# Patient Record
Sex: Male | Born: 1990 | Race: Black or African American | Hispanic: No | Marital: Single | Smoking: Never smoker
Health system: Southern US, Community
[De-identification: ages and names within clinical notes are randomized; demographics above are authoritative.]

---

## 2013-11-27 ENCOUNTER — Ambulatory Visit (INDEPENDENT_AMBULATORY_CARE_PROVIDER_SITE_OTHER): Payer: PRIVATE HEALTH INSURANCE | Admitting: Emergency Medicine

## 2013-11-27 VITALS — BP 110/78 | HR 60 | Temp 98.0°F | Resp 16 | Ht 68.0 in | Wt 202.0 lb

## 2013-11-27 DIAGNOSIS — Z025 Encounter for examination for participation in sport: Secondary | ICD-10-CM

## 2013-11-27 DIAGNOSIS — Z0000 Encounter for general adult medical examination without abnormal findings: Secondary | ICD-10-CM

## 2013-11-27 LAB — POCT CBC
Granulocyte percent: 55.3 %G (ref 37–80)
HEMATOCRIT: 48.9 % (ref 43.5–53.7)
Hemoglobin: 15.5 g/dL (ref 14.1–18.1)
Lymph, poc: 3.5 — AB (ref 0.6–3.4)
MCH, POC: 30.3 pg (ref 27–31.2)
MCHC: 31.7 g/dL — AB (ref 31.8–35.4)
MCV: 95.7 fL (ref 80–97)
MID (cbc): 0.7 (ref 0–0.9)
MPV: 10.1 fL (ref 0–99.8)
POC Granulocyte: 5.2 (ref 2–6.9)
POC LYMPH PERCENT: 37.2 %L (ref 10–50)
POC MID %: 7.5 %M (ref 0–12)
Platelet Count, POC: 260 10*3/uL (ref 142–424)
RBC: 5.11 M/uL (ref 4.69–6.13)
RDW, POC: 12.5 %
WBC: 9.4 10*3/uL (ref 4.6–10.2)

## 2013-11-27 LAB — SICKLE CELL SCREEN: Sickle Cell Screen: NEGATIVE

## 2013-11-27 NOTE — Progress Notes (Signed)
 @[facility] @  Patient ID: Trevor Blanchard MRN: [id number], DOB: [birth year] 23 y.o. Date of Encounter: 11/27/2013, 2:03 PM  Primary Physician: No PCP Per Patient  Chief Complaint: Physical (CPE)  HPI: 23 y.o. y/o male with history noted below here for CPE.  Doing well.  Pt states he is unaware if he has ever been tested for sickle cell disease.  He denies currently being on any medication. Denies any known allergies to medications. No issues/complaints.  Review of Systems: Consitutional: No fever, chills, fatigue, night sweats, lymphadenopathy, or weight changes. Eyes: No visual changes, eye redness, or discharge. ENT/Mouth: Ears: No otalgia, tinnitus, hearing loss, discharge. Nose: No congestion, rhinorrhea, sinus pain, or epistaxis. Throat: No sore throat, post nasal drip, or teeth pain. Cardiovascular: No CP, palpitations, diaphoresis, DOE, edema, orthopnea, PND. Respiratory: No cough, hemoptysis, SOB, or wheezing. Gastrointestinal: No anorexia, dysphagia, reflux, pain, nausea, vomiting, hematemesis, diarrhea, constipation, BRBPR, or melena. Genitourinary: No dysuria, frequency, urgency, hematuria, incontinence, nocturia, decreased urinary stream, discharge, impotence, or testicular pain/masses. Musculoskeletal: No decreased ROM, myalgias, stiffness, joint swelling, or weakness. Skin: No rash, erythema, lesion changes, pain, warmth, jaundice, or pruritis. Neurological: No headache, dizziness, syncope, seizures, tremors, memory loss, coordination problems, or paresthesias. Psychological: No anxiety, depression, hallucinations, SI/HI. Endocrine: No fatigue, polydipsia, polyphagia, polyuria, or known diabetes. All other systems were reviewed and are otherwise negative.  History reviewed. No pertinent past medical history.   History reviewed. No pertinent past surgical history.  Home Meds:  Prior to Admission medications   Not on File    Allergies: No Known Allergies  History   Social  History  . Marital Status: Single    Spouse Name: N/A    Number of Children: N/A  . Years of Education: N/A   Occupational History  . Not on file.   Social History Main Topics  . Smoking status: Never Smoker   . Smokeless tobacco: Not on file  . Alcohol Use: No  . Drug Use: No  . Sexual Activity: Not on file   Other Topics Concern  . Not on file   Social History Narrative  . No narrative on file    History reviewed. No pertinent family history.  Physical Exam: Blood pressure 110/78, pulse 60, temperature 98 F (36.7 C), temperature source Oral, resp. rate 16, height 5\' 8"  (1.727 m), weight 202 lb (91.627 kg), SpO2 98.00%.  General: Well developed, well nourished, in no acute distress. HEENT: Normocephalic, atraumatic. Conjunctiva pink, sclera non-icteric. Pupils 2 mm constricting to 1 mm, round, regular, and equally reactive to light and accomodation. EOMI. Internal auditory canal clear. TMs with good cone of light and without pathology. Nasal mucosa pink. Nares are without discharge. No sinus tenderness. Oral mucosa pink. Dentition. Pharynx without exudate.   Neck: Supple. Trachea midline. No thyromegaly. Full ROM. No lymphadenopathy. Lungs: Clear to auscultation bilaterally without wheezes, rales, or rhonchi. Breathing is of normal effort and unlabored. Cardiovascular: RRR with S1 S2. No murmurs, rubs, or gallops appreciated. Distal pulses 2+ symmetrically. No carotid or abdominal bruits. Abdomen: Soft, non-tender, non-distended with normoactive bowel sounds. No hepatosplenomegaly or masses. No rebound/guarding. No CVA tenderness. Without hernias.  Rectal: Genitourinary:  circumcised male. No penile lesions. Testes descended bilaterally, and smooth without tenderness or masses.  Musculoskeletal: Full range of motion and 5/5 strength throughout. Without swelling, atrophy, tenderness, crepitus, or warmth. Extremities without clubbing, cyanosis, or edema. Calves supple. Skin: Warm  and moist without erythema, ecchymosis, wounds, or rash. Neuro: A+Ox3. CN II-XII grossly intact. Moves all  extremities spontaneously. Full sensation throughout. Normal gait. DTR 2+ throughout upper and lower extremities. Finger to nose intact. Psych:  Responds to questions appropriately with a normal affect.   Studies: We'll check a sickle prep and CBC because he is going to be doing vigorous physical exercise Results for orders placed in visit on 11/27/13  POCT CBC      Result Value Range   WBC 9.4  4.6 - 10.2 K/uL   Lymph, poc 3.5 (*) 0.6 - 3.4   POC LYMPH PERCENT 37.2  10 - 50 %L   MID (cbc) 0.7  0 - 0.9   POC MID % 7.5  0 - 12 %M   POC Granulocyte 5.2  2 - 6.9   Granulocyte percent 55.3  37 - 80 %G   RBC 5.11  4.69 - 6.13 M/uL   Hemoglobin 15.5  14.1 - 18.1 g/dL   HCT, POC 16.1  09.6 - 53.7 %   MCV 95.7  80 - 97 fL   MCH, POC 30.3  27 - 31.2 pg   MCHC 31.7 (*) 31.8 - 35.4 g/dL   RDW, POC 04.5     Platelet Count, POC 260  142 - 424 K/uL   MPV 10.1  0 - 99.8 fL     Assessment/Plan:  23 y.o. y/o   male here for CPE will check studies as noted   -  Signed, Earl Lites, MD 11/27/2013 2:03 PM

## 2013-11-28 ENCOUNTER — Telehealth: Payer: Self-pay

## 2013-11-28 NOTE — Telephone Encounter (Signed)
Patient states he is returning a call about lab results.   [phone]

## 2013-11-29 NOTE — Addendum Note (Signed)
Addended by: Cydney OkAUGUSTIN, Sindy Mccune N on: 11/29/2013 11:43 AM   Modules accepted: Level of Service

## 2013-11-29 NOTE — Telephone Encounter (Signed)
Spoke with pt advised labs normal 

## 2013-12-23 ENCOUNTER — Encounter (HOSPITAL_COMMUNITY): Payer: Self-pay | Admitting: Emergency Medicine

## 2013-12-23 ENCOUNTER — Other Ambulatory Visit (HOSPITAL_COMMUNITY)
Admission: RE | Admit: 2013-12-23 | Discharge: 2013-12-23 | Disposition: A | Discharge status: Home / Self Care | Payer: BC Managed Care – PPO | Source: Ambulatory Visit | Attending: Family Medicine | Admitting: Family Medicine

## 2013-12-23 ENCOUNTER — Emergency Department (HOSPITAL_COMMUNITY)
Admission: EM | Admit: 2013-12-23 | Discharge: 2013-12-23 | Disposition: A | Discharge status: Home / Self Care | Payer: BC Managed Care – PPO | Source: Home / Self Care

## 2013-12-23 DIAGNOSIS — Z113 Encounter for screening for infections with a predominantly sexual mode of transmission: Secondary | ICD-10-CM | POA: Insufficient documentation

## 2013-12-23 DIAGNOSIS — N342 Other urethritis: Secondary | ICD-10-CM

## 2013-12-23 DIAGNOSIS — R369 Urethral discharge, unspecified: Secondary | ICD-10-CM

## 2013-12-23 MED ORDER — LIDOCAINE HCL (PF) 1 % IJ SOLN
INTRAMUSCULAR | Status: AC
  Filled 2013-12-23: qty 5

## 2013-12-23 MED ORDER — CEFTRIAXONE SODIUM 250 MG IJ SOLR
250.0000 mg | Freq: Once | INTRAMUSCULAR | Status: AC
  Administered 2013-12-23: 250 mg via INTRAMUSCULAR

## 2013-12-23 MED ORDER — DOXYCYCLINE HYCLATE 100 MG PO CAPS
100.0000 mg | ORAL_CAPSULE | Freq: Two times a day (BID) | ORAL | Status: DC
Start: 2013-12-23 — End: 2015-01-19

## 2013-12-23 MED ORDER — CEFTRIAXONE SODIUM 250 MG IJ SOLR
INTRAMUSCULAR | Status: AC
  Filled 2013-12-23: qty 250

## 2013-12-23 NOTE — Discharge Instructions (Signed)
Urethritis, Adult Urethritis is an inflammation of the tube through which urine exits your bladder (urethra).  CAUSES Urethritis is often caused by an infection in your urethra. The infection can be viral, like herpes. The infection can also be bacterial, like gonorrhea. RISK FACTORS Risk factors of urethritis include:  Having sex without using a condom.  Having multiple sexual partners.  Having poor hygiene. SIGNS AND SYMPTOMS Symptoms of urethritis are less noticeable in women than in men. These symptoms include:  Burning feeling when you urinate (dysuria).  Discharge from your urethra.  Blood in your urine (hematuria).  Urinating more than usual. DIAGNOSIS  To confirm a diagnosis of urethritis, your health care provider will do the following:  Ask about your sexual history.  Perform a physical exam.  Have you provide a sample of your urine for lab testing.  Use a cotton swab to gently collect a sample from your urethra for lab testing. TREATMENT  It is important to treat urethritis. Depending on the cause, untreated urethritis may lead to serious genital infections and possibly infertility. Urethritis caused by a bacterial infection is treated with antibiotics. All sexual partners must be treated.  HOME CARE INSTRUCTIONS  Do not have sex until the test results are known and treatment is completed, even if your symptoms go away before you finish treatment.  Finish all medicines that you are prescribed. SEEK MEDICAL CARE IF:   Your symptoms are not improved in 3 days.  Your symptoms are getting worse.  You develop abdominal pain or pelvic pain (in women).  You develop joint pain. SEEK IMMEDIATE MEDICAL CARE IF:   You have a fever with a temperature of 101.41F (38.8C) or greater.  You have severe pain in the belly, back, or side.  You have repeated vomiting. Document Released: 04/23/2001 Document Revised: 08/18/2013 Document Reviewed: 06/28/2013 [facility]  Patient Information 2014 [place], [place].  Sexually Transmitted Disease A sexually transmitted disease (STD) is a disease or infection that may be passed (transmitted) from person to person, usually during sexual activity. This may happen by way of saliva, semen, blood, vaginal mucus, or urine. Common STDs include:   Gonorrhea.   Chlamydia.   Syphilis.   HIV and AIDS.   Genital herpes.   Hepatitis B and C.   Trichomonas.   Human papillomavirus (HPV).   Pubic lice.   Scabies.  Mites.  Bacterial vaginosis. WHAT ARE CAUSES OF STDs? An STD may be caused by bacteria, a virus, or parasites. STDs are often transmitted during sexual activity if one person is infected. However, they may also be transmitted through nonsexual means. STDs may be transmitted after:   Sexual intercourse with an infected person.   Sharing sex toys with an infected person.   Sharing needles with an infected person or using unclean piercing or tattoo needles.  Having intimate contact with the genitals, mouth, or rectal areas of an infected person.   Exposure to infected fluids during birth. WHAT ARE THE SIGNS AND SYMPTOMS OF STDs? Different STDs have different symptoms. Some people may not have any symptoms. If symptoms are present, they may include:   Painful or bloody urination.   Pain in the pelvis, abdomen, vagina, anus, throat, or eyes.   Skin rash, itching, irritation, growths, sores (lesions), ulcerations, or warts in the genital or anal area.  Abnormal vaginal discharge with or without bad odor.   Penile discharge in men.   Fever.   Pain or bleeding during sexual intercourse.   Swollen glands  in the groin area.   Yellow skin and eyes (jaundice). This is seen with hepatitis.   Swollen testicles.  Infertility.  Sores and blisters in the mouth. HOW ARE STDs DIAGNOSED? To make a diagnosis, your health care provider may:   Take a medical history.   Perform  a physical exam.   Take a sample of any discharge for examination.  Swab the throat, cervix, opening to the penis, rectum, or vagina for testing.  Test a sample of your first morning urine.   Perform blood tests.   Perform a Pap smear, if this applies.   Perform a colposcopy.   Perform a laparoscopy.  HOW ARE STDs TREATED? Treatment depends on the STD. Some STDs may be treated but not cured.   Chlamydia, gonorrhea, trichomonas, and syphilis can be cured with antibiotics.   Genital herpes, hepatitis, and HIV can be treated, but not cured, with prescribed medicines. The medicines lessen symptoms.   Genital warts from HPV can be treated with medicine or by freezing, burning (electrocautery), or surgery. Warts may come back.   HPV cannot be cured with medicine or surgery. However, abnormal areas may be removed from the cervix, vagina, or vulva.   If your diagnosis is confirmed, your recent sexual partners need treatment. This is true even if they are symptom-free or have a negative culture or evaluation. They should not have sex until their health care providers say it is OK. HOW CAN I REDUCE MY RISK OF GETTING AN STD?  Use latex condoms, dental dams, and water-soluble lubricants during sexual activity. Do not use petroleum jelly or oils.  Get vaccinated for HPV and hepatitis. If you have not received these vaccines in the past, talk to your health care provider about whether one or both might be right for you.   Avoid risky sex practices that can break the skin.  WHAT SHOULD I DO IF I THINK I HAVE AN STD?  See your health care provider.   Inform all sexual partners. They should be tested and treated for any STDs.  Do not have sex until your health care provider says it is OK. WHEN SHOULD I GET HELP? Seek immediate medical care if:  You develop severe abdominal pain.  You are a man and notice swelling or pain in the testicles.  You are a woman and notice  swelling or pain in your vagina. Document Released: 01/18/2003 Document Revised: 08/18/2013 Document Reviewed: 05/18/2013 [facility]Exit[facility] Patient Information 2014 [place], [place]LL[place].

## 2013-12-23 NOTE — ED Provider Notes (Signed)
Medical screening examination/treatment/procedure(s) were performed by a resident physician or non-physician practitioner and as the supervising physician I was immediately available for consultation/collaboration.  Evan Corey, MD    Evan S Corey, MD 12/23/13 2112 

## 2013-12-23 NOTE — ED Notes (Signed)
Call back number verified.  

## 2013-12-23 NOTE — ED Notes (Signed)
C/o burning with urination that started 3 days ago. Has a discharge, denies any n/v/d. Written by: Marga MelnickQuaNeisha Jones, SMA

## 2013-12-23 NOTE — ED Provider Notes (Signed)
CSN: [id number]     Arrival date & time 12/23/13  1424 History   First MD Initiated Contact with Patient 12/23/13 1510     Chief Complaint  Patient presents with  . Exposure to STD     (Consider location/radiation/quality/duration/timing/severity/associated sxs/prior Treatment) HPI Comments: 23 year old male presents with a chief complaint of burning with urination and brownish colored penile discharge to 3 days. He is sexually active. Denies any lesions or genital pain.   History reviewed. No pertinent past medical history. History reviewed. No pertinent past surgical history. History reviewed. No pertinent family history. History  Substance Use Topics  . Smoking status: Never Smoker   . Smokeless tobacco: Not on file  . Alcohol Use: No    Review of Systems  Constitutional: Negative.   Respiratory: Negative.   Gastrointestinal: Negative.   Genitourinary: Positive for dysuria and discharge. Negative for frequency, flank pain, scrotal swelling, difficulty urinating and testicular pain.  Musculoskeletal: Negative.       Allergies  Review of patient's allergies indicates no known allergies.  Home Medications   Current Outpatient Rx  Name  Route  Sig  Dispense  Refill  . doxycycline (VIBRAMYCIN) 100 MG capsule   Oral   Take 1 capsule (100 mg total) by mouth 2 (two) times daily.   20 capsule   0    BP 146/80  Pulse 62  Temp(Src) 99.2 F (37.3 C) (Oral)  Resp 18  SpO2 97% Physical Exam  Nursing note and vitals reviewed. Constitutional: He is oriented to person, place, and time. He appears well-developed and well-nourished. No distress.  Neck: Normal range of motion. Neck supple.  Pulmonary/Chest: Effort normal. No respiratory distress.  Abdominal: Soft. He exhibits no mass. There is no tenderness.  Genitourinary: Penis normal. No penile tenderness.  Normal external male genitalia No scrotal, testicular or epididymal tenderness. No swelling. No pain in the penis  with palpation. The penis is uncircumcised. There is moisture within the foreskin but unable to discern any color. No other lesions observed.  Neurological: He is alert and oriented to person, place, and time. He exhibits normal muscle tone.  Skin: Skin is warm and dry.  Psychiatric: He has a normal mood and affect.    ED Course  Procedures (including critical care time) Labs Review Labs Reviewed  URINE CYTOLOGY ANCILLARY ONLY   Imaging Review No results found.    MDM   Final diagnoses:  Abnormal penile discharge  Urethritis    Doxycycline 100 mg twice a day for 10 days Rocephin 250 mg IM Cytology results pending If symptoms are not better in 4 days return for additional testing.    Hayden Rasmussenavid Amber Guthridge, NP 12/23/13 1525

## 2013-12-25 NOTE — ED Notes (Signed)
Patient called to inquire about test reports . Patient will advise partners about any positive findings, and will refrain from sex until full treatment course finished. Adequate treatment while in clinic

## 2013-12-25 NOTE — ED Notes (Signed)
[facility] DHHS [id number] [facility]

## 2014-04-27 ENCOUNTER — Emergency Department (HOSPITAL_COMMUNITY)
Admission: EM | Admit: 2014-04-27 | Discharge: 2014-04-27 | Disposition: A | Discharge status: Home / Self Care | Payer: BC Managed Care – PPO | Source: Home / Self Care | Attending: Family Medicine | Admitting: Family Medicine

## 2014-04-27 ENCOUNTER — Other Ambulatory Visit (HOSPITAL_COMMUNITY)
Admission: RE | Admit: 2014-04-27 | Discharge: 2014-04-27 | Disposition: A | Discharge status: Home / Self Care | Payer: BC Managed Care – PPO | Source: Ambulatory Visit | Attending: Family Medicine | Admitting: Family Medicine

## 2014-04-27 ENCOUNTER — Encounter (HOSPITAL_COMMUNITY): Payer: Self-pay | Admitting: Emergency Medicine

## 2014-04-27 DIAGNOSIS — Z113 Encounter for screening for infections with a predominantly sexual mode of transmission: Secondary | ICD-10-CM | POA: Insufficient documentation

## 2014-04-27 DIAGNOSIS — N342 Other urethritis: Secondary | ICD-10-CM

## 2014-04-27 LAB — HIV ANTIBODY (ROUTINE TESTING W REFLEX): HIV 1&2 Ab, 4th Generation: NONREACTIVE

## 2014-04-27 LAB — RPR

## 2014-04-27 MED ORDER — AZITHROMYCIN 250 MG PO TABS
1000.0000 mg | ORAL_TABLET | Freq: Once | ORAL | Status: AC
  Administered 2014-04-27: 1000 mg via ORAL

## 2014-04-27 MED ORDER — CEFTRIAXONE SODIUM 250 MG IJ SOLR
250.0000 mg | Freq: Once | INTRAMUSCULAR | Status: AC
  Administered 2014-04-27: 250 mg via INTRAMUSCULAR

## 2014-04-27 MED ORDER — AZITHROMYCIN 250 MG PO TABS
ORAL_TABLET | ORAL | Status: AC
  Filled 2014-04-27: qty 4

## 2014-04-27 MED ORDER — LIDOCAINE HCL (PF) 1 % IJ SOLN
INTRAMUSCULAR | Status: AC
  Filled 2014-04-27: qty 5

## 2014-04-27 MED ORDER — CEFTRIAXONE SODIUM 250 MG IJ SOLR
INTRAMUSCULAR | Status: AC
  Filled 2014-04-27: qty 250

## 2014-04-27 NOTE — ED Provider Notes (Signed)
Trevor Blanchard is a 23 y.o. male who presents to Urgent Care today for penile discharge associated with burning sensation present for a few weeks. Similar to prior episodes of gonorrhea and Chlamydia. No fevers or chills nausea vomiting or diarrhea. Patient has had unprotected sex since his last test and treatment.   History reviewed. No pertinent past medical history. History  Substance Use Topics  . Smoking status: Never Smoker   . Smokeless tobacco: Not on file  . Alcohol Use: No   ROS as above Medications: No current facility-administered medications for this encounter.   Current Outpatient Prescriptions  Medication Sig Dispense Refill  . doxycycline (VIBRAMYCIN) 100 MG capsule Take 1 capsule (100 mg total) by mouth 2 (two) times daily.  20 capsule  0    Exam:  BP 126/76  Pulse 51  Temp(Src) 98.3 F (36.8 C) (Oral)  Resp 16  SpO2 100% Gen: Well NAD Genital: No lymphadenopathy. Testicles are descended bilaterally no masses or tenderness. The penis is uncircumcised normal. No lesions. Small amount of discharge from the glans.   No results found for this or any previous visit (from the past 24 hour(s)). No results found.  Assessment and Plan: 23 y.o. male with urethritis. Cytology pending. Empiric treatment with ceftriaxone and azithromycin. HIV and RPR test pending as well.  Discussed warning signs or symptoms. Please see discharge instructions. Patient expresses understanding.    Rodolph BongEvan S Corey, MD 04/27/14 [phone]

## 2014-04-27 NOTE — Discharge Instructions (Signed)
Thank you for coming in today.   Sexually Transmitted Disease A sexually transmitted disease (STD) is a disease or infection that may be passed (transmitted) from person to person, usually during sexual activity. This may happen by way of saliva, semen, blood, vaginal mucus, or urine. Common STDs include:   Gonorrhea.   Chlamydia.   Syphilis.   HIV and AIDS.   Genital herpes.   Hepatitis B and C.   Trichomonas.   Human papillomavirus (HPV).   Pubic lice.   Scabies.  Mites.  Bacterial vaginosis. WHAT ARE CAUSES OF STDs? An STD may be caused by bacteria, a virus, or parasites. STDs are often transmitted during sexual activity if one person is infected. However, they may also be transmitted through nonsexual means. STDs may be transmitted after:   Sexual intercourse with an infected person.   Sharing sex toys with an infected person.   Sharing needles with an infected person or using unclean piercing or tattoo needles.  Having intimate contact with the genitals, mouth, or rectal areas of an infected person.   Exposure to infected fluids during birth. WHAT ARE THE SIGNS AND SYMPTOMS OF STDs? Different STDs have different symptoms. Some people may not have any symptoms. If symptoms are present, they may include:   Painful or bloody urination.   Pain in the pelvis, abdomen, vagina, anus, throat, or eyes.   A skin rash, itching, or irritation.  Growths, ulcerations, blisters, or sores in the genital and anal areas.  Abnormal vaginal discharge with or without bad odor.   Penile discharge in men.   Fever.   Pain or bleeding during sexual intercourse.   Swollen glands in the groin area.   Yellow skin and eyes (jaundice). This is seen with hepatitis.   Swollen testicles.  Infertility.  Sores and blisters in the mouth. HOW ARE STDs DIAGNOSED? To make a diagnosis, your health care provider may:   Take a medical history.   Perform a  physical exam.   Take a sample of any discharge to examine.  Swab the throat, cervix, opening to the penis, rectum, or vagina for testing.  Test a sample of your first morning urine.   Perform blood tests.   Perform a Pap test, if this applies.   Perform a colposcopy.   Perform a laparoscopy.  HOW ARE STDs TREATED? Treatment depends on the STD. Some STDs may be treated but not cured.   Chlamydia, gonorrhea, trichomonas, and syphilis can be cured with antibiotic medicine.   Genital herpes, hepatitis, and HIV can be treated, but not cured, with prescribed medicines. The medicines lessen symptoms.   Genital warts from HPV can be treated with medicine or by freezing, burning (electrocautery), or surgery. Warts may come back.   HPV cannot be cured with medicine or surgery. However, abnormal areas may be removed from the cervix, vagina, or vulva.   If your diagnosis is confirmed, your recent sexual partners need treatment. This is true even if they are symptom-free or have a negative culture or evaluation. They should not have sex until their health care providers say it is okay. HOW CAN I REDUCE MY RISK OF GETTING AN STD? Take these steps to reduce your risk of getting an STD:  Use latex condoms, dental dams, and water-soluble lubricants during sexual activity. Do not use petroleum jelly or oils.  Avoid having multiple sex partners.  Do not have sex with someone who has other sex partners.  Do not have sex with anyone  you do not know or who is at high risk for an STD.  Avoid risky sex practices that can break your skin.  Do not have sex if you have open sores on your mouth or skin.  Avoid drinking too much alcohol or taking illegal drugs. Alcohol and drugs can affect your judgment and put you in a vulnerable position.  Avoid engaging in oral and anal sex acts.  Get vaccinated for HPV and hepatitis. If you have not received these vaccines in the past, talk to your  health care provider about whether one or both might be right for you.   If you are at risk of being infected with HIV, it is recommended that you take a prescription medicine daily to prevent HIV infection. This is called pre-exposure prophylaxis (PrEP). You are considered at risk if:  You are a man who has sex with other men (MSM).  You are a heterosexual man or woman and are sexually active with more than one partner.  You take drugs by injection.  You are sexually active with a partner who has HIV.  Talk with your health care provider about whether you are at high risk of being infected with HIV. If you choose to begin PrEP, you should first be tested for HIV. You should then be tested every 3 months for as long as you are taking PrEP.  WHAT SHOULD I DO IF I THINK I HAVE AN STD?  See your health care provider.   Tell your sexual partner(s). They should be tested and treated for any STDs.  Do not have sex until your health care provider says it is okay. WHEN SHOULD I GET IMMEDIATE MEDICAL CARE? Contact your health care provider right away if:   You have severe abdominal pain.  You are a man and notice swelling or pain in your testicles.  You are a woman and notice swelling or pain in your vagina. Document Released: 01/18/2003 Document Revised: 11/02/2013 Document Reviewed: 05/18/2013 [facility]ExitCare Patient Information 2015 [place]ExitCare, [place]LL[place]. This information is not intended to replace advice given to you by your health care provider. Make sure you discuss any questions you have with your health care provider.  Urethritis, Adult Urethritis is an inflammation of the tube through which urine exits your bladder (urethra).  CAUSES Urethritis is often caused by an infection in your urethra. The infection can be viral, like herpes. The infection can also be bacterial, like gonorrhea. RISK FACTORS Risk factors of urethritis include:  Having sex without using a condom.  Having multiple  sexual partners.  Having poor hygiene. SIGNS AND SYMPTOMS Symptoms of urethritis are less noticeable in women than in men. These symptoms include:  Burning feeling when you urinate (dysuria).  Discharge from your urethra.  Blood in your urine (hematuria).  Urinating more than usual. DIAGNOSIS  To confirm a diagnosis of urethritis, your health care provider will do the following:  Ask about your sexual history.  Perform a physical exam.  Have you provide a sample of your urine for lab testing.  Use a cotton swab to gently collect a sample from your urethra for lab testing. TREATMENT  It is important to treat urethritis. Depending on the cause, untreated urethritis may lead to serious genital infections and possibly infertility. Urethritis caused by a bacterial infection is treated with antibiotics. All sexual partners must be treated.  HOME CARE INSTRUCTIONS  Do not have sex until the test results are known and treatment is completed, even if your  symptoms go away before you finish treatment.  Finish all medicines that you are prescribed. SEEK MEDICAL CARE IF:   Your symptoms are not improved in 3 days.  Your symptoms are getting worse.  You develop abdominal pain or pelvic pain (in women).  You develop joint pain. SEEK IMMEDIATE MEDICAL CARE IF:   You have a fever with a temperature of 101.49F (38.8C) or greater.  You have severe pain in the belly, back, or side.  You have repeated vomiting. Document Released: 04/23/2001 Document Revised: 08/18/2013 Document Reviewed: 06/28/2013 [facility]Exit[facility] Patient Information 2015 [place]ExitCare, [place]LL[place]. This information is not intended to replace advice given to you by your health care provider. Make sure you discuss any questions you have with your health care provider.

## 2014-04-27 NOTE — ED Notes (Signed)
C/o penile discharge  States he previously was treat for gonorrhea x 2 months ago States he has burning when urinating

## 2014-04-28 ENCOUNTER — Telehealth (HOSPITAL_COMMUNITY): Payer: Self-pay | Admitting: *Deleted

## 2014-04-28 NOTE — ED Notes (Signed)
Pt. called for his lab results.  Pt. verified x 2 and given results. (GC and Trich neg., Chlamydia pos., HIV/RPR non-reactive).  Pt. told he was adequately treated with Zithromax.  Pt. instructed to notify his partner, no sex for 1 week and to practice safe sex. Pt. told he [facility] by appointment.  Pt. voiced understanding.  [facility]. Vassie MoselleYork, Suzanne M 04/28/2014

## 2014-05-03 NOTE — ED Notes (Signed)
Pt. called for his lab results.  Hard to understand name.  I asked for him to repeat it. Sounds like a male on the phone.  Pt. verified x 2 and chart accessed.  I told the person on the phone that I already gave him his results on 6/18.  He said his Mom was there and wants to hear them.   I told the person on the phone that "you are 23 yo and you can tell your Mom your results."  The person said OK and hung up. Vassie MoselleYork, Suzanne M 05/03/2014

## 2014-06-07 ENCOUNTER — Other Ambulatory Visit (HOSPITAL_COMMUNITY)
Admission: RE | Admit: 2014-06-07 | Discharge: 2014-06-07 | Disposition: A | Discharge status: Home / Self Care | Payer: BC Managed Care – PPO | Source: Ambulatory Visit | Attending: Emergency Medicine | Admitting: Emergency Medicine

## 2014-06-07 ENCOUNTER — Emergency Department (HOSPITAL_COMMUNITY)
Admission: EM | Admit: 2014-06-07 | Discharge: 2014-06-07 | Disposition: A | Discharge status: Home / Self Care | Payer: BC Managed Care – PPO | Source: Home / Self Care | Attending: Emergency Medicine | Admitting: Emergency Medicine

## 2014-06-07 ENCOUNTER — Encounter (HOSPITAL_COMMUNITY): Payer: Self-pay | Admitting: Emergency Medicine

## 2014-06-07 DIAGNOSIS — N342 Other urethritis: Secondary | ICD-10-CM

## 2014-06-07 DIAGNOSIS — Z113 Encounter for screening for infections with a predominantly sexual mode of transmission: Secondary | ICD-10-CM | POA: Insufficient documentation

## 2014-06-07 LAB — POCT URINALYSIS DIP (DEVICE)
Glucose, UA: NEGATIVE mg/dL
Hgb urine dipstick: NEGATIVE
Ketones, ur: NEGATIVE mg/dL
Leukocytes, UA: NEGATIVE
Nitrite: NEGATIVE
PH: 5.5 (ref 5.0–8.0)
Protein, ur: NEGATIVE mg/dL
Specific Gravity, Urine: >=1.03 (ref 1.005–1.030)
Urobilinogen, UA: 0.2 mg/dL (ref 0.0–1.0)

## 2014-06-07 NOTE — ED Provider Notes (Signed)
CSN: [id number]     Arrival date & time 06/07/14  1714 History   First MD Initiated Contact with Patient 06/07/14 1815     Chief Complaint  Patient presents with  . Exposure to STD   (Consider location/radiation/quality/duration/timing/severity/associated sxs/prior Treatment) HPI Comments: 23 year old male presents requesting STD testing. He complains of a very mild tingling sensation after urination a couple of times over the past 2 weeks, and he may have noticed a slight discharge this morning. He has a history of Chlamydia treated about a month ago. He just wants to make sure that he has gotten all the way better. He denies being sexually active since then. No genital lesions or testicle pain.  Patient is a 23 y.o. male presenting with STD exposure.  Exposure to STD    History reviewed. No pertinent past medical history. History reviewed. No pertinent past surgical history. History reviewed. No pertinent family history. History  Substance Use Topics  . Smoking status: Never Smoker   . Smokeless tobacco: Not on file  . Alcohol Use: No    Review of Systems  Genitourinary: Positive for dysuria and discharge.  All other systems reviewed and are negative.   Allergies  Review of patient's allergies indicates no known allergies.  Home Medications   Prior to Admission medications   Medication Sig Start Date End Date Taking? Authorizing Provider  doxycycline (VIBRAMYCIN) 100 MG capsule Take 1 capsule (100 mg total) by mouth 2 (two) times daily. 12/23/13   Hayden Rasmussenavid Mabe, NP   BP 116/71  Pulse 56  Temp(Src) 98.1 F (36.7 C) (Oral)  Resp 12  SpO2 100% Physical Exam  Nursing note and vitals reviewed. Constitutional: He is oriented to person, place, and time. He appears well-developed and well-nourished. No distress.  HENT:  Head: Normocephalic.  Pulmonary/Chest: Effort normal. No respiratory distress.  Abdominal: Soft. There is no tenderness.  Genitourinary: Testes normal and  penis normal. Right testis shows no tenderness. Left testis shows no tenderness. Uncircumcised.  Lymphadenopathy:       Right: No inguinal adenopathy present.       Left: No inguinal adenopathy present.  Neurological: He is alert and oriented to person, place, and time. Coordination normal.  Skin: Skin is warm and dry. No rash noted. He is not diaphoretic.  Psychiatric: He has a normal mood and affect. Judgment normal.    ED Course  Procedures (including critical care time) Labs Review Labs Reviewed  POCT URINALYSIS DIP (DEVICE) - Abnormal; Notable for the following:    Bilirubin Urine SMALL (*)    All other components within normal limits  URINE CYTOLOGY ANCILLARY ONLY    Imaging Review No results found.   MDM   1. Urethritis    Exam is normal. Sent urine cytology. Will call with results.    Graylon GoodZachary H Taylr Meuth, PA-C 06/07/14 1906

## 2014-06-07 NOTE — Discharge Instructions (Signed)
Urethritis °Urethritis is an inflammation of the tube through which urine exits your bladder (urethra).  °CAUSES °Urethritis is often caused by an infection in your urethra. The infection can be viral, like herpes. The infection can also be bacterial, like gonorrhea. °RISK FACTORS °Risk factors of urethritis include: °· Having sex without using a condom. °· Having multiple sexual partners. °· Having poor hygiene. °SIGNS AND SYMPTOMS °Symptoms of urethritis are less noticeable in women than in men. These symptoms include: °· Burning feeling when you urinate (dysuria). °· Discharge from your urethra. °· Blood in your urine (hematuria). °· Urinating more than usual. °DIAGNOSIS  °To confirm a diagnosis of urethritis, your health care provider will do the following: °· Ask about your sexual history. °· Perform a physical exam. °· Have you provide a sample of your urine for lab testing. °· Use a cotton swab to gently collect a sample from your urethra for lab testing. °TREATMENT  °It is important to treat urethritis. Depending on the cause, untreated urethritis may lead to serious genital infections and possibly infertility. Urethritis caused by a bacterial infection is treated with antibiotic medicine. All sexual partners must be treated.  °HOME CARE INSTRUCTIONS °· Do not have sex until the test results are known and treatment is completed, even if your symptoms go away before you finish treatment. °· If you were prescribed an antibiotic, finish it all even if you start to feel better. °SEEK MEDICAL CARE IF:  °· Your symptoms are not improved in 3 days. °· Your symptoms are getting worse. °· You develop abdominal pain or pelvic pain (in women). °· You develop joint pain. °· You have a fever. °SEEK IMMEDIATE MEDICAL CARE IF:  °· You have severe pain in the belly, back, or side. °· You have repeated vomiting. °MAKE SURE YOU: °· Understand these instructions. °· Will watch your condition. °· Will get help right away if you  are not doing well or get worse. °Document Released: 04/23/2001 Document Revised: 03/14/2014 Document Reviewed: 06/28/2013 °ExitCare® Patient Information ©2015 [employer]. This information is not intended to replace advice given to you by your health care provider. Make sure you discuss any questions you have with your health care provider. ° °

## 2014-06-07 NOTE — ED Provider Notes (Signed)
Medical screening examination/treatment/procedure(s) were performed by resident physician or non-physician practitioner and as supervising physician I was immediately available for consultation/collaboration.  Randal BubaErin Honig, MD     Charm RingsErin J Honig, MD 06/07/14 [phone]

## 2014-06-07 NOTE — ED Notes (Signed)
He thinks he saw a discharge this AM.  C/o of tingling in his penis off and on x 2 weeks.  Denies any sex.  States he came back to be rechecked.

## 2014-06-09 NOTE — ED Notes (Addendum)
1906  [facility]. States he called several times today for his results. I told him I was in staffing all day, today, because staff member had a meeting.  I said I have not picked up phone messages today.  Labs printed.  Pt. verified x 2 and shown his neg results.  GC/Chlamydia and Trich neg. Vassie MoselleYork, Thomas Mabry M 06/09/2014

## 2014-06-11 ENCOUNTER — Encounter (HOSPITAL_COMMUNITY): Payer: Self-pay | Admitting: Emergency Medicine

## 2014-06-11 ENCOUNTER — Emergency Department (HOSPITAL_COMMUNITY)
Admission: EM | Admit: 2014-06-11 | Discharge: 2014-06-11 | Disposition: A | Discharge status: Home / Self Care | Payer: BC Managed Care – PPO | Attending: Emergency Medicine | Admitting: Emergency Medicine

## 2014-06-11 DIAGNOSIS — N4889 Other specified disorders of penis: Secondary | ICD-10-CM | POA: Insufficient documentation

## 2014-06-11 DIAGNOSIS — IMO0002 Reserved for concepts with insufficient information to code with codable children: Secondary | ICD-10-CM

## 2014-06-11 DIAGNOSIS — Z792 Long term (current) use of antibiotics: Secondary | ICD-10-CM | POA: Insufficient documentation

## 2014-06-11 DIAGNOSIS — Z202 Contact with and (suspected) exposure to infections with a predominantly sexual mode of transmission: Secondary | ICD-10-CM | POA: Insufficient documentation

## 2014-06-11 DIAGNOSIS — N489 Disorder of penis, unspecified: Secondary | ICD-10-CM

## 2014-06-11 DIAGNOSIS — R1909 Other intra-abdominal and pelvic swelling, mass and lump: Secondary | ICD-10-CM | POA: Insufficient documentation

## 2014-06-11 MED ORDER — DOXYCYCLINE HYCLATE 100 MG PO CAPS
100.0000 mg | ORAL_CAPSULE | Freq: Two times a day (BID) | ORAL | Status: DC
Start: 2014-06-11 — End: 2015-01-19

## 2014-06-11 NOTE — ED Provider Notes (Signed)
CSN: [id number]     Arrival date & time 06/11/14  1842 History  This chart was scribed for Jaynie Crumble, PA, working with Flint Melter, MD by Chestine Spore, ED Scribe. The patient was seen in room TR08C/TR08C at 7:02 PM.   Chief Complaint  Patient presents with  . Exposure to STD     The history is provided by the patient. No language interpreter was used.   HPI Comments: Trevor Blanchard is a 23 y.o. male who presents to the Emergency Department complaining of an exposure to STD onset 2 weeks. He states that he did not pay much attention to it. He states that it is not getting worse, its just still there. He states that he has a raised area to the right groin area. He states that the area is tender to touch.He states that he noticed the bump on his penis today.  He states that there is also a bump on his penis. He denies dysuria, dyspareunia, and any other associated symptoms.    History reviewed. No pertinent past medical history. History reviewed. No pertinent past surgical history. History reviewed. No pertinent family history. History  Substance Use Topics  . Smoking status: Never Smoker   . Smokeless tobacco: Not on file  . Alcohol Use: No    Review of Systems  Genitourinary: Negative for dysuria.       No dyspareunia.   Skin:       "bump" on penis and on the groin.     Allergies  Review of patient's allergies indicates no known allergies.  Home Medications   Prior to Admission medications   Medication Sig Start Date End Date Taking? Authorizing Provider  doxycycline (VIBRAMYCIN) 100 MG capsule Take 1 capsule (100 mg total) by mouth 2 (two) times daily. 12/23/13   Hayden Rasmussen, NP   BP 130/83  Pulse 53  Temp(Src) 98.6 F (37 C) (Oral)  Resp 18  SpO2 100%  Physical Exam  Nursing note and vitals reviewed. Constitutional: He is oriented to person, place, and time. He appears well-developed and well-nourished. No distress.  HENT:  Head: Normocephalic and  atraumatic.  Eyes: EOM are normal.  Neck: Neck supple. No tracheal deviation present.  Cardiovascular: Normal rate.   Pulmonary/Chest: Effort normal. No respiratory distress.  Genitourinary:  1 cm cystic structure in the right inguinal area, skin-colored, nontender. No drainage. No surrounding erythema or induration. Small penile pustules to the right of the mid shaft. No tenderness to palpation. Not vesicular  Musculoskeletal: Normal range of motion.  Neurological: He is alert and oriented to person, place, and time.  Skin: Skin is warm and dry.  Psychiatric: He has a normal mood and affect. His behavior is normal.    ED Course  Procedures (including critical care time) DIAGNOSTIC STUDIES: Oxygen Saturation is 100% on room air, normal by my interpretation.    COORDINATION OF CARE: 7:05 PM-Discussed treatment plan which includes labs and Vibramycin with pt at bedside and pt agreed to plan.   Labs Review Labs Reviewed - No data to display  Imaging Review No results found.   EKG Interpretation None      MDM   Final diagnoses:  None    I suspect patient's mass in the inguinal canal is most likely a sebaceous cyst. It is nontender, not indurated, not erythematous, do not suspect infection and do not think he needs to be incised and drained given its small size at this time. Will start him on  doxycycline. He does have a lesion on his shaft of the penis, it is nontender, it is a single lesion, doubt herpes. Sent cultures for gonorrhea and Chlamydia, RPR sent as well. Patient states he was recently HIV tested and is negative. Will discharge home with outpatient followup.  Filed Vitals:   06/11/14 1845  BP: 130/83  Pulse: 53  Temp: 98.6 F (37 C)  TempSrc: Oral  Resp: 18  SpO2: 100%      I personally performed the services described in this documentation, which was scribed in my presence. The recorded information has been reviewed and is accurate.    Lottie Musselatyana A  Shaquan Puerta, PA-C 06/11/14 2328

## 2014-06-11 NOTE — Discharge Instructions (Signed)
Warm compresses to the groin. Doxycycline as prescribed until all gone. Your cultures are pending. Follow up as needed. If your cultures return abnormal you will be contacted by phone.    Epidermal Cyst An epidermal cyst is sometimes called a sebaceous cyst, epidermal inclusion cyst, or infundibular cyst. These cysts usually contain a substance that looks "pasty" or "cheesy" and may have a bad smell. This substance is a protein called keratin. Epidermal cysts are usually found on the face, neck, or trunk. They may also occur in the vaginal area or other parts of the genitalia of both men and women. Epidermal cysts are usually small, painless, slow-growing bumps or lumps that move freely under the skin. It is important not to try to pop them. This may cause an infection and lead to tenderness and swelling. CAUSES  Epidermal cysts may be caused by a deep penetrating injury to the skin or a plugged hair follicle, often associated with acne. SYMPTOMS  Epidermal cysts can become inflamed and cause:  Redness.  Tenderness.  Increased temperature of the skin over the bumps or lumps.  Grayish-white, bad smelling material that drains from the bump or lump. DIAGNOSIS  Epidermal cysts are easily diagnosed by your caregiver during an exam. Rarely, a tissue sample (biopsy) may be taken to rule out other conditions that may resemble epidermal cysts. TREATMENT   Epidermal cysts often get better and disappear on their own. They are rarely ever cancerous.  If a cyst becomes infected, it may become inflamed and tender. This may require opening and draining the cyst. Treatment with antibiotics may be necessary. When the infection is gone, the cyst may be removed with minor surgery.  Small, inflamed cysts can often be treated with antibiotics or by injecting steroid medicines.  Sometimes, epidermal cysts become large and bothersome. If this happens, surgical removal in your caregiver's office may be  necessary. HOME CARE INSTRUCTIONS  Only take over-the-counter or prescription medicines as directed by your caregiver.  Take your antibiotics as directed. Finish them even if you start to feel better. SEEK MEDICAL CARE IF:   Your cyst becomes tender, red, or swollen.  Your condition is not improving or is getting worse.  You have any other questions or concerns. MAKE SURE YOU:  Understand these instructions.  Will watch your condition.  Will get help right away if you are not doing well or get worse. Document Released: 09/28/2004 Document Revised: 01/20/2012 Document Reviewed: 05/06/2011 [facility] Patient Information 2015 [place], [place]. This information is not intended to replace advice given to you by your health care provider. Make sure you discuss any questions you have with your health care provider.

## 2014-06-11 NOTE — ED Notes (Addendum)
Pt presents to department for evaluation of raised area to R groin area. Ongoing x2 weeks. Pt states area is tender to touch. No drainage noted. Also states "bump" on penis. Pt is alert and oriented x4. NAD.

## 2014-06-12 LAB — RPR

## 2014-06-12 NOTE — ED Provider Notes (Signed)
Medical screening examination/treatment/procedure(s) were performed by non-physician practitioner and as supervising physician I was immediately available for consultation/collaboration.   EKG Interpretation None       Flint MelterElliott L Dorothie Wah, MD 06/12/14 [phone]

## 2014-06-13 LAB — GC/CHLAMYDIA PROBE AMP
CT PROBE, AMP APTIMA: NEGATIVE
GC Probe RNA: NEGATIVE

## 2015-01-19 ENCOUNTER — Emergency Department (INDEPENDENT_AMBULATORY_CARE_PROVIDER_SITE_OTHER): Payer: BLUE CROSS/BLUE SHIELD

## 2015-01-19 ENCOUNTER — Encounter (HOSPITAL_COMMUNITY): Payer: Self-pay | Admitting: Emergency Medicine

## 2015-01-19 ENCOUNTER — Emergency Department (INDEPENDENT_AMBULATORY_CARE_PROVIDER_SITE_OTHER)
Admission: EM | Admit: 2015-01-19 | Discharge: 2015-01-19 | Disposition: A | Discharge status: Home / Self Care | Payer: BLUE CROSS/BLUE SHIELD | Source: Home / Self Care | Attending: Family Medicine | Admitting: Family Medicine

## 2015-01-19 DIAGNOSIS — J40 Bronchitis, not specified as acute or chronic: Secondary | ICD-10-CM

## 2015-01-19 MED ORDER — IPRATROPIUM-ALBUTEROL 0.5-2.5 (3) MG/3ML IN SOLN
3.0000 mL | Freq: Once | RESPIRATORY_TRACT | Status: AC
  Administered 2015-01-19: 3 mL via RESPIRATORY_TRACT

## 2015-01-19 MED ORDER — PREDNISONE 10 MG PO TABS: 30.0000 mg | ORAL_TABLET | Freq: Every day | ORAL | Status: DC

## 2015-01-19 MED ORDER — IPRATROPIUM-ALBUTEROL 0.5-2.5 (3) MG/3ML IN SOLN
RESPIRATORY_TRACT | Status: AC
  Filled 2015-01-19: qty 3

## 2015-01-19 NOTE — ED Provider Notes (Signed)
Trevor Blanchard is a 24 y.o. male who presents to Urgent Care today for cough wheezing and shortness of breath. Symptoms present for 2 weeks. Patient is known to 4 weeks of mild congestion. No vomiting or diarrhea. Patient has tried Tylenol which helps some. He denies any history of asthma. He denies any history of smoking. No fevers chills chest pains or palpitations.   History reviewed. No pertinent past medical history. History reviewed. No pertinent past surgical history. History  Substance Use Topics  . Smoking status: Never Smoker   . Smokeless tobacco: Not on file  . Alcohol Use: No   ROS as above Medications: No current facility-administered medications for this encounter.   Current Outpatient Prescriptions  Medication Sig Dispense Refill  . predniSONE (DELTASONE) 10 MG tablet Take 3 tablets (30 mg total) by mouth daily. 15 tablet 0   No Known Allergies   Exam:  BP 116/78 mmHg  Pulse 55  Temp(Src) 99 F (37.2 C) (Oral)  Resp 14  SpO2 100% Gen: Well NAD HEENT: EOMI,  MMM posterior pharynx is normal as are tympanic membranes bilaterally Lungs: Normal work of breathing. Rhonchi left lung field Heart: RRR no MRG Abd: NABS, Soft. Nondistended, Nontender Exts: Brisk capillary refill, warm and well perfused.   Patient was given a 2.5/0.5 mg DuoNeb nebulizer treatment, and felt better  No results found for this or any previous visit (from the past 24 hour(s)). Dg Chest 2 View  01/19/2015   CLINICAL DATA:  Post breathing treatment  EXAM: CHEST  2 VIEW  COMPARISON:  None.  FINDINGS: The heart size and mediastinal contours are within normal limits. Both lungs are clear. The visualized skeletal structures are unremarkable.  IMPRESSION: No active cardiopulmonary disease.   Electronically Signed   By: Signa Kellaylor  Stroud M.D.   On: 01/19/2015 15:25    Assessment and Plan: 24 y.o. male with bronchitis. Treat with prednisone. Continue Tylenol or ibuprofen. Return as needed.  Discussed  warning signs or symptoms. Please see discharge instructions. Patient expresses understanding.     Rodolph BongEvan S Angela Vazguez, MD 01/19/15 [phone]

## 2015-01-19 NOTE — Discharge Instructions (Signed)
Thank you for coming in today. Take prednisone daily. Return as needed. Call or go to the emergency room if you get worse, have trouble breathing, have chest pains, or palpitations.   Acute Bronchitis Bronchitis is inflammation of the airways that extend from the windpipe into the lungs (bronchi). The inflammation often causes mucus to develop. This leads to a cough, which is the most common symptom of bronchitis.  In acute bronchitis, the condition usually develops suddenly and goes away over time, usually in a couple weeks. Smoking, allergies, and asthma can make bronchitis worse. Repeated episodes of bronchitis may cause further lung problems.  CAUSES Acute bronchitis is most often caused by the same virus that causes a cold. The virus can spread from person to person (contagious) through coughing, sneezing, and touching contaminated objects. SIGNS AND SYMPTOMS   Cough.   Fever.   Coughing up mucus.   Body aches.   Chest congestion.   Chills.   Shortness of breath.   Sore throat.  DIAGNOSIS  Acute bronchitis is usually diagnosed through a physical exam. Your health care provider will also ask you questions about your medical history. Tests, such as chest X-rays, are sometimes done to rule out other conditions.  TREATMENT  Acute bronchitis usually goes away in a couple weeks. Oftentimes, no medical treatment is necessary. Medicines are sometimes given for relief of fever or cough. Antibiotic medicines are usually not needed but may be prescribed in certain situations. In some cases, an inhaler may be recommended to help reduce shortness of breath and control the cough. A cool mist vaporizer may also be used to help thin bronchial secretions and make it easier to clear the chest.  HOME CARE INSTRUCTIONS  Get plenty of rest.   Drink enough fluids to keep your urine clear or pale yellow (unless you have a medical condition that requires fluid restriction). Increasing fluids  may help thin your respiratory secretions (sputum) and reduce chest congestion, and it will prevent dehydration.   Take medicines only as directed by your health care provider.  If you were prescribed an antibiotic medicine, finish it all even if you start to feel better.  Avoid smoking and secondhand smoke. Exposure to cigarette smoke or irritating chemicals will make bronchitis worse. If you are a smoker, consider using nicotine gum or skin patches to help control withdrawal symptoms. Quitting smoking will help your lungs heal faster.   Reduce the chances of another bout of acute bronchitis by washing your hands frequently, avoiding people with cold symptoms, and trying not to touch your hands to your mouth, nose, or eyes.   Keep all follow-up visits as directed by your health care provider.  SEEK MEDICAL CARE IF: Your symptoms do not improve after 1 week of treatment.  SEEK IMMEDIATE MEDICAL CARE IF:  You develop an increased fever or chills.   You have chest pain.   You have severe shortness of breath.  You have bloody sputum.   You develop dehydration.  You faint or repeatedly feel like you are going to pass out.  You develop repeated vomiting.  You develop a severe headache. MAKE SURE YOU:   Understand these instructions.  Will watch your condition.  Will get help right away if you are not doing well or get worse. Document Released: 12/05/2004 Document Revised: 03/14/2014 Document Reviewed: 04/20/2013 [facility] Patient Information 2015 [place], [place]. This information is not intended to replace advice given to you by your health care provider. Make sure you discuss  any questions you have with your health care provider. ° °

## 2015-01-19 NOTE — ED Notes (Signed)
Pt states that he has had a cough and congestion for a month.

## 2015-02-12 ENCOUNTER — Encounter (HOSPITAL_COMMUNITY): Payer: Self-pay | Admitting: Emergency Medicine

## 2015-02-12 ENCOUNTER — Other Ambulatory Visit (HOSPITAL_COMMUNITY)
Admission: RE | Admit: 2015-02-12 | Discharge: 2015-02-12 | Disposition: A | Discharge status: Home / Self Care | Payer: BLUE CROSS/BLUE SHIELD | Source: Ambulatory Visit | Attending: Family Medicine | Admitting: Family Medicine

## 2015-02-12 ENCOUNTER — Emergency Department (HOSPITAL_COMMUNITY)
Admission: EM | Admit: 2015-02-12 | Discharge: 2015-02-12 | Disposition: A | Discharge status: Home / Self Care | Payer: BLUE CROSS/BLUE SHIELD | Source: Home / Self Care | Attending: Family Medicine | Admitting: Family Medicine

## 2015-02-12 DIAGNOSIS — R300 Dysuria: Secondary | ICD-10-CM | POA: Diagnosis not present

## 2015-02-12 DIAGNOSIS — R369 Urethral discharge, unspecified: Secondary | ICD-10-CM

## 2015-02-12 DIAGNOSIS — Z113 Encounter for screening for infections with a predominantly sexual mode of transmission: Secondary | ICD-10-CM | POA: Diagnosis not present

## 2015-02-12 DIAGNOSIS — Z711 Person with feared health complaint in whom no diagnosis is made: Secondary | ICD-10-CM

## 2015-02-12 LAB — POCT URINALYSIS DIP (DEVICE)
BILIRUBIN URINE: NEGATIVE
GLUCOSE, UA: NEGATIVE mg/dL
HGB URINE DIPSTICK: NEGATIVE
Ketones, ur: NEGATIVE mg/dL
Leukocytes, UA: NEGATIVE
NITRITE: NEGATIVE
Protein, ur: NEGATIVE mg/dL
Specific Gravity, Urine: 1.025 (ref 1.005–1.030)
UROBILINOGEN UA: 2 mg/dL — AB (ref 0.0–1.0)
pH: 6.5 (ref 5.0–8.0)

## 2015-02-12 MED ORDER — AZITHROMYCIN 250 MG PO TABS
ORAL_TABLET | ORAL | Status: AC
  Filled 2015-02-12: qty 4

## 2015-02-12 MED ORDER — AZITHROMYCIN 250 MG PO TABS
1000.0000 mg | ORAL_TABLET | Freq: Once | ORAL | Status: AC
  Administered 2015-02-12: 1000 mg via ORAL

## 2015-02-12 MED ORDER — CEFTRIAXONE SODIUM 250 MG IJ SOLR
250.0000 mg | Freq: Once | INTRAMUSCULAR | Status: AC
  Administered 2015-02-12: 250 mg via INTRAMUSCULAR

## 2015-02-12 MED ORDER — CEFTRIAXONE SODIUM 250 MG IJ SOLR
INTRAMUSCULAR | Status: AC
  Filled 2015-02-12: qty 250

## 2015-02-12 MED ORDER — LIDOCAINE HCL (PF) 1 % IJ SOLN
INTRAMUSCULAR | Status: AC
  Filled 2015-02-12: qty 5

## 2015-02-12 NOTE — Discharge Instructions (Signed)

## 2015-02-12 NOTE — ED Provider Notes (Signed)
CSN: [id number]     Arrival date & time 02/12/15  [id number] History   First MD Initiated Contact with Patient 02/12/15 [id number]     Chief Complaint  Patient presents with  . Exposure to STD   (Consider location/radiation/quality/duration/timing/severity/associated sxs/prior Treatment) HPI Comments: 24 year old male admits that he received oral sex last week. Proximal with 4 days ago he experienced penile discharged and urinary burning. He was to be treated and tested for STD. He denies having lesions of the skin, bumps or growths or other abnormalities.   History reviewed. No pertinent past medical history. History reviewed. No pertinent past surgical history. History reviewed. No pertinent family history. History  Substance Use Topics  . Smoking status: Never Smoker   . Smokeless tobacco: Not on file  . Alcohol Use: No    Review of Systems  Constitutional: Negative.   HENT: Negative.   Genitourinary: Positive for dysuria and discharge. Negative for flank pain, penile swelling, scrotal swelling, genital sores and testicular pain.    Allergies  Review of patient's allergies indicates no known allergies.  Home Medications   Prior to Admission medications   Medication Sig Start Date End Date Taking? Authorizing Provider  predniSONE (DELTASONE) 10 MG tablet Take 3 tablets (30 mg total) by mouth daily. 01/19/15   Rodolph BongEvan S Corey, MD   BP 135/77 mmHg  Pulse 51  Temp(Src) 99 F (37.2 C) (Oral)  Resp 16  SpO2 99% Physical Exam  Constitutional: He is oriented to person, place, and time. He appears well-developed and well-nourished. No distress.  Neck: Normal range of motion. Neck supple.  Pulmonary/Chest: Effort normal. No respiratory distress.  Genitourinary: Penis normal. No penile tenderness.  Neurological: He is alert and oriented to person, place, and time.  Skin: Skin is warm and dry.  Psychiatric: He has a normal mood and affect.  Nursing note and vitals reviewed.   ED Course   Procedures (including critical care time) Labs Review Labs Reviewed  POCT URINALYSIS DIP (DEVICE) - Abnormal; Notable for the following:    Urobilinogen, UA 2.0 (*)    All other components within normal limits  URINE CYTOLOGY ANCILLARY ONLY   Results for orders placed or performed during the hospital encounter of 02/12/15  POCT urinalysis dip (device)  Result Value Ref Range   Glucose, UA NEGATIVE NEGATIVE mg/dL   Bilirubin Urine NEGATIVE NEGATIVE   Ketones, ur NEGATIVE NEGATIVE mg/dL   Specific Gravity, Urine 1.025 1.005 - 1.030   Hgb urine dipstick NEGATIVE NEGATIVE   pH 6.5 5.0 - 8.0   Protein, ur NEGATIVE NEGATIVE mg/dL   Urobilinogen, UA 2.0 (H) 0.0 - 1.0 mg/dL   Nitrite NEGATIVE NEGATIVE   Leukocytes, UA NEGATIVE NEGATIVE    Imaging Review No results found.   MDM   1. Abnormal penile discharge   2. Dysuria   3. Concern about STD in male without diagnosis     Rocephin 250 mg IM azithro 1gm po Urine cytology pending.    Hayden Rasmussenavid Rivers Hamrick, NP 02/12/15 1003

## 2015-02-12 NOTE — ED Notes (Signed)
Pt states that he has had a penile discharge for 4 days with painful urination pt states that these symptoms happened after he had oral sexual intercourse.

## 2015-02-13 LAB — URINE CYTOLOGY ANCILLARY ONLY
Chlamydia: NEGATIVE
Neisseria Gonorrhea: NEGATIVE
Trichomonas: NEGATIVE

## 2015-03-07 ENCOUNTER — Emergency Department (HOSPITAL_COMMUNITY)
Admission: EM | Admit: 2015-03-07 | Discharge: 2015-03-07 | Disposition: A | Discharge status: Home / Self Care | Payer: BLUE CROSS/BLUE SHIELD | Source: Home / Self Care | Attending: Family Medicine | Admitting: Family Medicine

## 2015-03-07 ENCOUNTER — Other Ambulatory Visit (HOSPITAL_COMMUNITY)
Admission: RE | Admit: 2015-03-07 | Discharge: 2015-03-07 | Disposition: A | Discharge status: Home / Self Care | Payer: BLUE CROSS/BLUE SHIELD | Source: Ambulatory Visit | Attending: Family Medicine | Admitting: Family Medicine

## 2015-03-07 ENCOUNTER — Encounter (HOSPITAL_COMMUNITY): Payer: Self-pay | Admitting: Emergency Medicine

## 2015-03-07 DIAGNOSIS — N341 Nonspecific urethritis: Secondary | ICD-10-CM | POA: Diagnosis not present

## 2015-03-07 DIAGNOSIS — Z113 Encounter for screening for infections with a predominantly sexual mode of transmission: Secondary | ICD-10-CM | POA: Insufficient documentation

## 2015-03-07 MED ORDER — DOXYCYCLINE HYCLATE 100 MG PO CAPS: 100.0000 mg | ORAL_CAPSULE | Freq: Two times a day (BID) | ORAL | Status: DC

## 2015-03-07 NOTE — ED Notes (Signed)
Pt states he has had tingling, discharge, odor and burning with urination for 2 days.  Pt reports his partner told him she tested positive for Chlamydia, but then was later told it was negative.

## 2015-03-07 NOTE — Discharge Instructions (Signed)
Use medicine as prescribed, we will call if tests show a need for other treatment.  

## 2015-03-07 NOTE — ED Provider Notes (Signed)
CSN: [id number]     Arrival date & time 03/07/15  [id number] History   First MD Initiated Contact with Patient 03/07/15 1124     Chief Complaint  Patient presents with  . Exposure to STD   (Consider location/radiation/quality/duration/timing/severity/associated sxs/prior Treatment) Patient is a 24 y.o. male presenting with STD exposure. The history is provided by the patient.  Exposure to STD This is a new problem. The current episode started 2 days ago. The problem has been gradually worsening. Pertinent negatives include no abdominal pain. Associated symptoms comments: Clear urethral d/c.    History reviewed. No pertinent past medical history. History reviewed. No pertinent past surgical history. History reviewed. No pertinent family history. History  Substance Use Topics  . Smoking status: Never Smoker   . Smokeless tobacco: Never Used  . Alcohol Use: No    Review of Systems  Gastrointestinal: Negative for abdominal pain.  Genitourinary: Positive for dysuria and discharge. Negative for penile swelling, scrotal swelling, genital sores, penile pain and testicular pain.  Hematological: Negative for adenopathy.    Allergies  Review of patient's allergies indicates no known allergies.  Home Medications   Prior to Admission medications   Medication Sig Start Date End Date Taking? Authorizing Provider  doxycycline (VIBRAMYCIN) 100 MG capsule Take 1 capsule (100 mg total) by mouth 2 (two) times daily. 03/07/15   Linna HoffJames D Salomon Ganser, MD  predniSONE (DELTASONE) 10 MG tablet Take 3 tablets (30 mg total) by mouth daily. 01/19/15   Rodolph BongEvan S Corey, MD   BP 137/90 mmHg  Pulse 50  Temp(Src) 99.2 F (37.3 C) (Oral)  Resp 16  SpO2 98% Physical Exam  Constitutional: He is oriented to person, place, and time. He appears well-developed and well-nourished.  Abdominal: Hernia confirmed negative in the right inguinal area and confirmed negative in the left inguinal area.  Genitourinary: Testes normal and  penis normal. Uncircumcised.  Lymphadenopathy:       Right: No inguinal adenopathy present.       Left: No inguinal adenopathy present.  Neurological: He is alert and oriented to person, place, and time.  Skin: Skin is warm and dry.  Nursing note and vitals reviewed.   ED Course  Procedures (including critical care time) Labs Review Labs Reviewed  CYTOLOGY, (ORAL, ANAL, URETHRAL) ANCILLARY ONLY    Imaging Review No results found.   MDM   1. Nonspecific urethritis        Linna HoffJames D Larri Brewton, MD 03/07/15 [phone]

## 2015-03-08 LAB — CYTOLOGY, (ORAL, ANAL, URETHRAL) ANCILLARY ONLY
Chlamydia: NEGATIVE
Neisseria Gonorrhea: NEGATIVE

## 2015-04-18 ENCOUNTER — Encounter (HOSPITAL_COMMUNITY): Payer: Self-pay | Admitting: Emergency Medicine

## 2015-04-18 ENCOUNTER — Emergency Department (HOSPITAL_COMMUNITY)
Admission: EM | Admit: 2015-04-18 | Discharge: 2015-04-18 | Disposition: A | Discharge status: Home / Self Care | Payer: BLUE CROSS/BLUE SHIELD | Source: Home / Self Care | Attending: Emergency Medicine | Admitting: Emergency Medicine

## 2015-04-18 DIAGNOSIS — N481 Balanitis: Secondary | ICD-10-CM

## 2015-04-18 MED ORDER — CLOTRIMAZOLE 1 % EX CREA: 1.0000 "application " | TOPICAL_CREAM | Freq: Two times a day (BID) | CUTANEOUS | Status: AC

## 2015-04-18 NOTE — ED Notes (Signed)
Rash for 2 weeks

## 2015-04-18 NOTE — ED Provider Notes (Signed)
CSN: [id number]     Arrival date & time 04/18/15  1531 History   First MD Initiated Contact with Patient 04/18/15 1725     Chief Complaint  Patient presents with  . Rash   (Consider location/radiation/quality/duration/timing/severity/associated sxs/prior Treatment) HPI  Trevor Blanchard is a 24 year old man here for evaluation of rash. Trevor Blanchard states Trevor Blanchard has had a red itchy rash on his penis for the last 2 weeks. Trevor Blanchard has been using a cream from his mom and it will help temporarily. Trevor Blanchard does not know what the cream is. Trevor Blanchard denies any penile discharge, dysuria, abdominal pain. No fevers. No new sexual partners.  History reviewed. No pertinent past medical history. History reviewed. No pertinent past surgical history. No family history on file. History  Substance Use Topics  . Smoking status: Never Smoker   . Smokeless tobacco: Never Used  . Alcohol Use: No    Review of Systems As in history of present illness Allergies  Review of patient's allergies indicates no known allergies.  Home Medications   Prior to Admission medications   Medication Sig Start Date End Date Taking? Authorizing Provider  clotrimazole (LOTRIMIN) 1 % cream Apply 1 application topically 2 (two) times daily. 04/18/15   Charm RingsErin J Honig, MD   There were no vitals taken for this visit. Physical Exam  Constitutional: Trevor Blanchard is oriented to person, place, and time. Trevor Blanchard appears well-developed and well-nourished. No distress.  Cardiovascular: Normal rate.   Pulmonary/Chest: Effort normal.  Genitourinary: Uncircumcised. Penile erythema (mild erythema of the glands and penile shaft.  No vesicles or papules.) present.  Neurological: Trevor Blanchard is alert and oriented to person, place, and time.    ED Course  Procedures (including critical care time) Labs Review Labs Reviewed - No data to display  Imaging Review No results found.   MDM   1. Balanitis    We'll treat with clotrimazole cream. Hygiene discussed. Follow-up as needed.    Charm RingsErin J  Honig, MD 04/18/15 [phone]

## 2015-04-18 NOTE — Discharge Instructions (Signed)
Wash the area with soap and water twice a day. Dry the area very well. Apply clotrimazole cream twice a day. If no improvement in 1 week, please come back.

## 2015-09-13 ENCOUNTER — Encounter (HOSPITAL_COMMUNITY): Payer: Self-pay | Admitting: *Deleted

## 2015-09-13 ENCOUNTER — Other Ambulatory Visit (HOSPITAL_COMMUNITY)
Admission: RE | Admit: 2015-09-13 | Discharge: 2015-09-13 | Disposition: A | Discharge status: Home / Self Care | Payer: BLUE CROSS/BLUE SHIELD | Source: Ambulatory Visit | Attending: Family Medicine | Admitting: Family Medicine

## 2015-09-13 ENCOUNTER — Emergency Department (INDEPENDENT_AMBULATORY_CARE_PROVIDER_SITE_OTHER)
Admission: EM | Admit: 2015-09-13 | Discharge: 2015-09-13 | Disposition: A | Discharge status: Home / Self Care | Payer: BLUE CROSS/BLUE SHIELD | Source: Home / Self Care | Attending: Family Medicine | Admitting: Family Medicine

## 2015-09-13 DIAGNOSIS — Z113 Encounter for screening for infections with a predominantly sexual mode of transmission: Secondary | ICD-10-CM | POA: Diagnosis not present

## 2015-09-13 DIAGNOSIS — A64 Unspecified sexually transmitted disease: Secondary | ICD-10-CM

## 2015-09-13 IMAGING — DX DG CHEST 2V
2 series · 2 of 2 positions shown · non-contrast
Comparison: None.

CLINICAL DATA: Post breathing treatment

EXAM:
CHEST  2 VIEW

[chest pa]
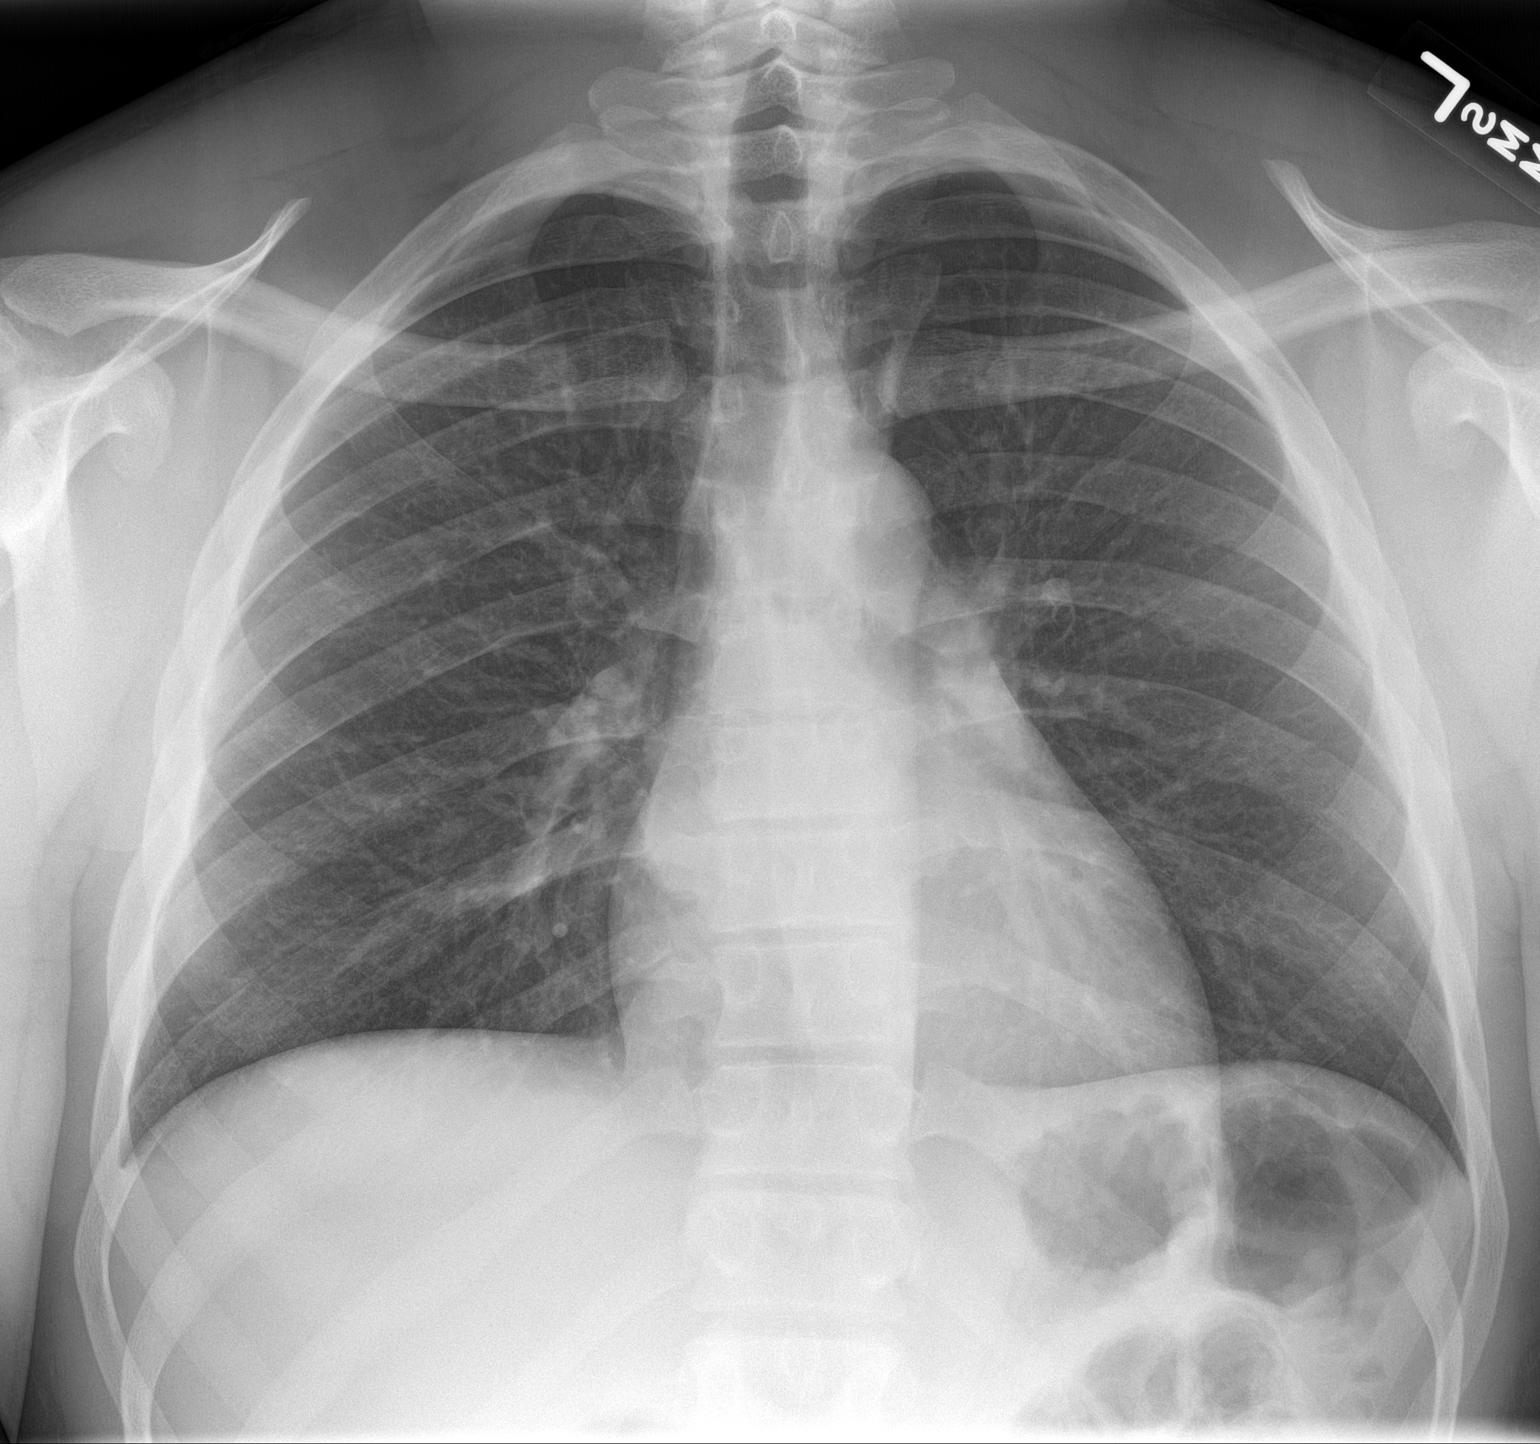

[chest lat]
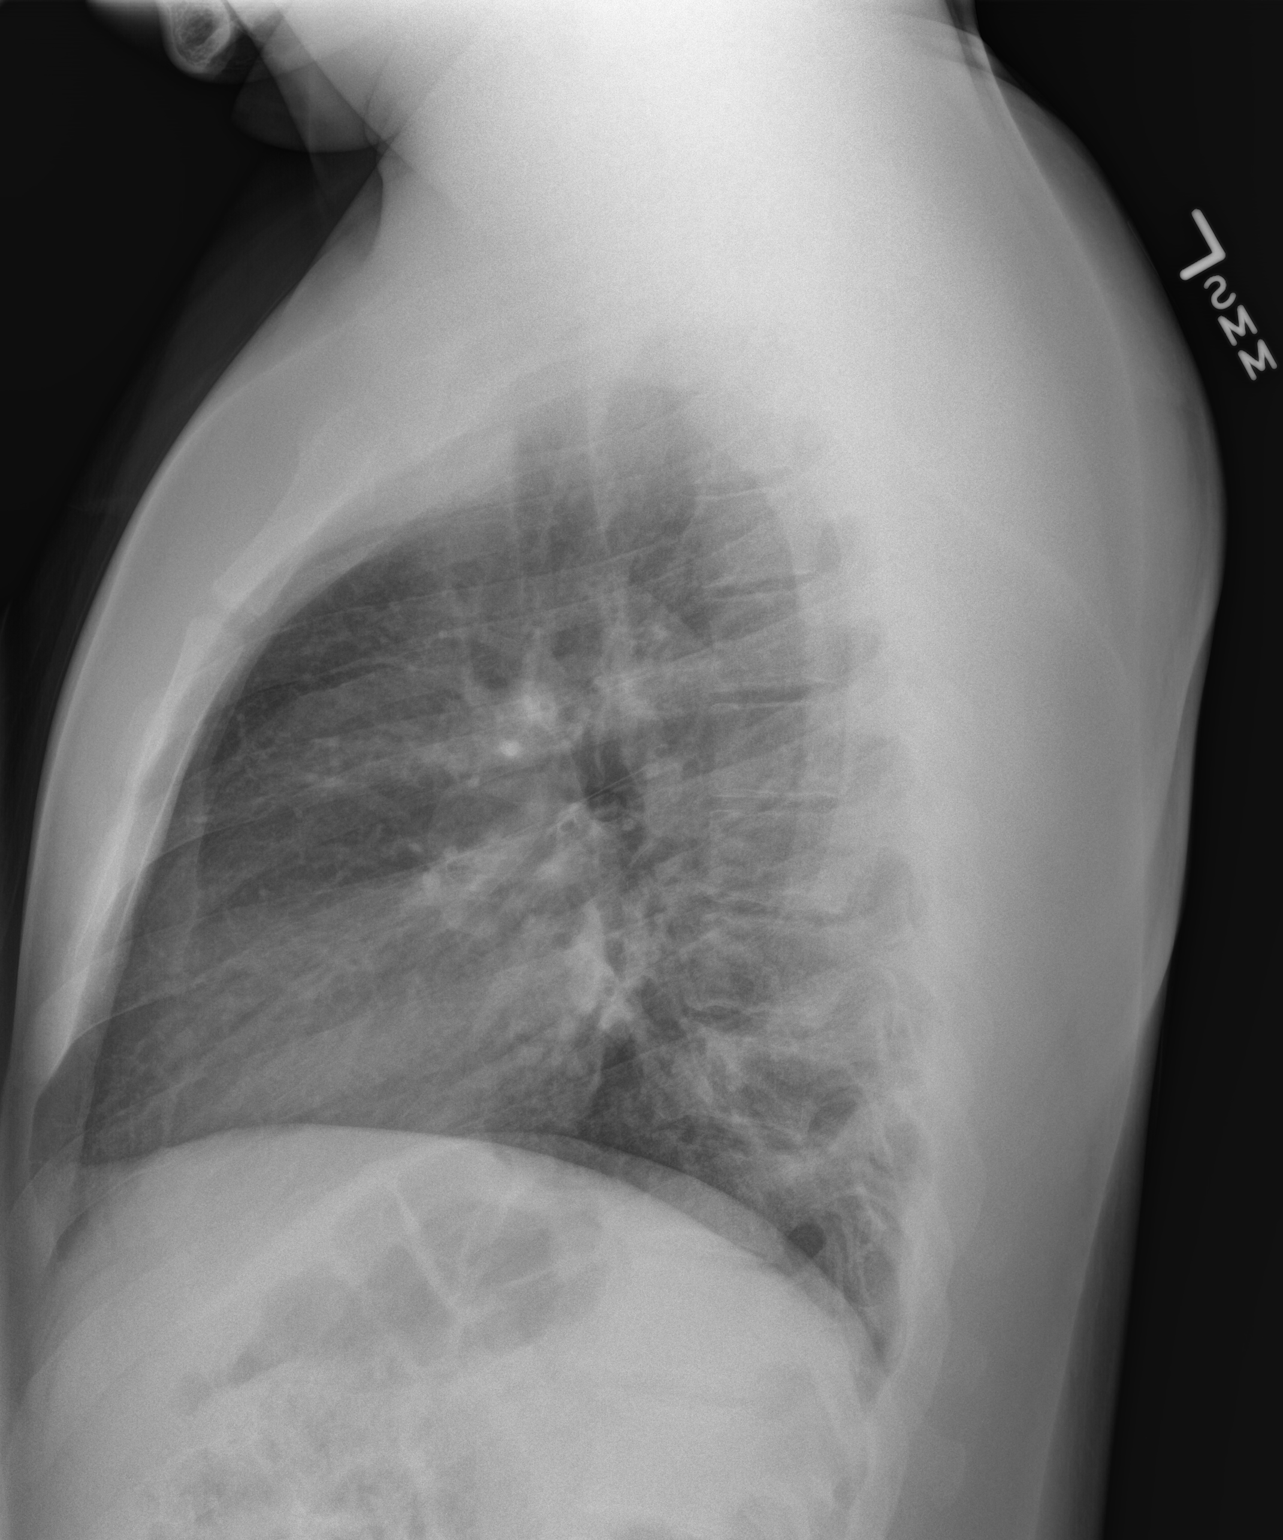

[2 of 2 positions shown; findings below may reference images not displayed]

FINDINGS: The heart size and mediastinal contours are within normal limits.
Both lungs are clear. The visualized skeletal structures are
unremarkable.
IMPRESSION: No active cardiopulmonary disease.

## 2015-09-13 MED ORDER — CEFTRIAXONE SODIUM 250 MG IJ SOLR
INTRAMUSCULAR | Status: AC
Start: 2015-09-13 — End: 2015-09-13
  Filled 2015-09-13: qty 250

## 2015-09-13 MED ORDER — AZITHROMYCIN 250 MG PO TABS
1000.0000 mg | ORAL_TABLET | Freq: Once | ORAL | Status: AC
  Administered 2015-09-13: 1000 mg via ORAL

## 2015-09-13 MED ORDER — CEFTRIAXONE SODIUM 250 MG IJ SOLR
250.0000 mg | Freq: Once | INTRAMUSCULAR | Status: AC
  Administered 2015-09-13: 250 mg via INTRAMUSCULAR

## 2015-09-13 MED ORDER — AZITHROMYCIN 250 MG PO TABS
ORAL_TABLET | ORAL | Status: AC
  Filled 2015-09-13: qty 4

## 2015-09-13 NOTE — Discharge Instructions (Signed)
We will call with positive test results and treat as indicated  °

## 2015-09-13 NOTE — ED Provider Notes (Signed)
CSN: [id number]     Arrival date & time 09/13/15  1321 History   First MD Initiated Contact with Patient 09/13/15 1451     Chief Complaint  Patient presents with  . SEXUALLY TRANSMITTED DISEASE   (Consider location/radiation/quality/duration/timing/severity/associated sxs/prior Treatment) Patient is a 24 y.o. male presenting with STD exposure. The history is provided by the patient.  Exposure to STD This is a new problem. The current episode started more than 2 days ago. The problem has been gradually worsening. Pertinent negatives include no chest pain and no abdominal pain.    History reviewed. No pertinent past medical history. History reviewed. No pertinent past surgical history. History reviewed. No pertinent family history. Social History  Substance Use Topics  . Smoking status: Never Smoker   . Smokeless tobacco: Never Used  . Alcohol Use: No    Review of Systems  Constitutional: Negative.   Cardiovascular: Negative for chest pain.  Gastrointestinal: Negative.  Negative for abdominal pain.  Genitourinary: Positive for dysuria and discharge. Negative for penile swelling, scrotal swelling, penile pain and testicular pain.  Musculoskeletal: Negative.   Skin: Negative.   All other systems reviewed and are negative.   Allergies  Review of patient's allergies indicates no known allergies.  Home Medications   Prior to Admission medications   Medication Sig Start Date End Date Taking? Authorizing Provider  clotrimazole (LOTRIMIN) 1 % cream Apply 1 application topically 2 (two) times daily. 04/18/15   Charm RingsErin J Honig, MD   Meds Ordered and Administered this Visit   Medications  azithromycin (ZITHROMAX) tablet 1,000 mg (not administered)  cefTRIAXone (ROCEPHIN) injection 250 mg (not administered)    BP 127/80 mmHg  Pulse 60  Temp(Src) 98.2 F (36.8 C) (Oral)  Resp 18  SpO2 100% No data found.   Physical Exam  Constitutional: He is oriented to person, place, and  time. He appears well-developed and well-nourished. No distress.  Abdominal: Soft. Bowel sounds are normal.  Genitourinary: Penis normal. No penile tenderness.  Neurological: He is alert and oriented to person, place, and time.  Skin: Skin is warm and dry.  Nursing note and vitals reviewed.   ED Course  Procedures (including critical care time)  Labs Review Labs Reviewed  CYTOLOGY, (ORAL, ANAL, URETHRAL) ANCILLARY ONLY    Imaging Review No results found.   Visual Acuity Review  Right Eye Distance:   Left Eye Distance:   Bilateral Distance:    Right Eye Near:   Left Eye Near:    Bilateral Near:         MDM   1. STD (male)        Linna HoffJames D Lyvia Mondesir, MD 09/13/15 [phone]

## 2015-09-13 NOTE — ED Notes (Signed)
Pt   reports     Symptoms  Of penile   Discharge        And  Burning  When he     Urinates   Pt  Sitting  Upright  On  The  Exam table   Speaking  In  Complete   sentances    He   Is  In no  Acute  Distress     Speaking in   Complete   sentances

## 2015-09-15 LAB — CYTOLOGY, (ORAL, ANAL, URETHRAL) ANCILLARY ONLY
Chlamydia: NEGATIVE
Neisseria Gonorrhea: NEGATIVE

## 2015-09-15 NOTE — ED Notes (Signed)
Final report of STD testing negative 

## 2015-10-14 ENCOUNTER — Emergency Department (HOSPITAL_COMMUNITY)
Admission: EM | Admit: 2015-10-14 | Discharge: 2015-10-14 | Disposition: A | Discharge status: Home / Self Care | Payer: BLUE CROSS/BLUE SHIELD | Attending: Emergency Medicine | Admitting: Emergency Medicine

## 2015-10-14 ENCOUNTER — Encounter (HOSPITAL_COMMUNITY): Payer: Self-pay | Admitting: Emergency Medicine

## 2015-10-14 DIAGNOSIS — N342 Other urethritis: Secondary | ICD-10-CM | POA: Insufficient documentation

## 2015-10-14 DIAGNOSIS — Z79899 Other long term (current) drug therapy: Secondary | ICD-10-CM | POA: Diagnosis not present

## 2015-10-14 DIAGNOSIS — R369 Urethral discharge, unspecified: Secondary | ICD-10-CM | POA: Diagnosis present

## 2015-10-14 MED ORDER — AZITHROMYCIN 250 MG PO TABS
1000.0000 mg | ORAL_TABLET | Freq: Once | ORAL | Status: AC
  Administered 2015-10-14: 1000 mg via ORAL
  Filled 2015-10-14: qty 4

## 2015-10-14 MED ORDER — CEFTRIAXONE SODIUM 250 MG IJ SOLR
250.0000 mg | Freq: Once | INTRAMUSCULAR | Status: AC
  Administered 2015-10-14: 250 mg via INTRAMUSCULAR
  Filled 2015-10-14: qty 250

## 2015-10-14 MED ORDER — LIDOCAINE HCL (PF) 1 % IJ SOLN
5.0000 mL | Freq: Once | INTRAMUSCULAR | Status: AC
  Administered 2015-10-14: 2 mL
  Filled 2015-10-14: qty 5

## 2015-10-14 NOTE — ED Provider Notes (Signed)
CSN: [id number]     Arrival date & time 10/14/15  4401 History  By signing my name below, I, Soijett Blue, attest that this documentation has been prepared under the direction and in the presence of [employer], PA-C. Electronically Signed: Soijett Blue, ED Scribe. 10/14/2015. 9:18 AM.   Chief Complaint  Patient presents with  . SEXUALLY TRANSMITTED DISEASE    The history is provided by the patient. No language interpreter was used.    Trevor Blanchard is a 24 y.o. male who presents to the Emergency Department complaining of moderate penile discharge x 2-3 days. Pt reports that he is sexually active and that he used a condom the last time but the condom broke. He states that he has had gonorrhea in that past that he was treated for. Per pt chart review, pt has been seen 3 times in the past 1.5 years for similar symptoms and treated each time. He states that she is having associated symptoms of dysuria. He states that he has not tried any medications for the relief for his symptoms. He denies penile pain/swelling, testicular pain/swelling, abdominal pain, n/v, fever, genital lesion, rash, and any other symptoms. Denies allergies to any medications.   History reviewed. No pertinent past medical history. History reviewed. No pertinent past surgical history. No family history on file. Social History  Substance Use Topics  . Smoking status: Never Smoker   . Smokeless tobacco: Never Used  . Alcohol Use: No    Review of Systems  Constitutional: Negative for fever and chills.  HENT: Negative for sore throat.   Eyes: Negative for discharge.  Gastrointestinal: Negative for abdominal pain and rectal pain.  Genitourinary: Positive for dysuria and discharge. Negative for frequency, hematuria, penile swelling, scrotal swelling, difficulty urinating, genital sores, penile pain and testicular pain.  Musculoskeletal: Negative for arthralgias.  Skin: Negative for color change and rash.  Hematological:  Negative for adenopathy.    Allergies  Review of patient's allergies indicates no known allergies.  Home Medications   Prior to Admission medications   Medication Sig Start Date End Date Taking? Authorizing Provider  clotrimazole (LOTRIMIN) 1 % cream Apply 1 application topically 2 (two) times daily. 04/18/15   Charm Rings, MD   BP 134/69 mmHg  Pulse 50  Temp(Src) 97.8 F (36.6 C) (Oral)  Resp 18  Ht  (1.753 m)  Wt 190 lb (86.183 kg)  BMI 28.05 kg/m2  SpO2 100%   Physical Exam  Constitutional: He is oriented to person, place, and time. He appears well-developed and well-nourished. No distress.  HENT:  Head: Normocephalic and atraumatic.  Eyes: EOM are normal.  Neck: Neck supple.  Cardiovascular: Normal rate.   Pulmonary/Chest: Effort normal.  Genitourinary:    Uncircumcised. No penile tenderness. Discharge (scant) found.  Musculoskeletal: Normal range of motion.  Neurological: He is alert and oriented to person, place, and time.  Skin: Skin is warm and dry. He is not diaphoretic.  Psychiatric: He has a normal mood and affect. His behavior is normal.  Nursing note and vitals reviewed.   ED Course  Procedures (including critical care time) DIAGNOSTIC STUDIES: Oxygen Saturation is 100% on RA, nl by my interpretation.    COORDINATION OF CARE: 9:15 AM Discussed treatment plan with pt at bedside which includes HIV, RPR, and GC/chlamydia probe and pt agreed to plan.  Labs Review Labs Reviewed  HIV ANTIBODY (ROUTINE TESTING)  RPR  GC/CHLAMYDIA PROBE AMP ([place]) NOT AT [facility]    Imaging Review  No results found. I have personally reviewed and evaluated these lab results as part of my medical decision-making.   EKG Interpretation None       9:28 AM Patient seen and examined.   Vital signs reviewed and are as follows: BP 134/69 mmHg  Pulse 50  Temp(Src) 97.8 F (36.6 C) (Oral)  Resp 18  Ht 5\' 9"  (1.753 m)  Wt 86.183 kg  BMI 28.05 kg/m2  SpO2  100%  Will test and treat for STD exposure. Patient offered HIV and syphilis testing and agrees. Patient counseled on safe sexual practices. Told them that they should not have sexual contact for next 7 days and that they need to inform sexual partners so that they can get tested and treated as well. Patient verbalizes understanding and agrees with plan.     MDM   Final diagnoses:  Urethritis   Patient with s/s of urethritis, likely sexually transmitted infection.   I personally performed the services described in this documentation, which was scribed in my presence. The recorded information has been reviewed and is accurate.    Renne CriglerJoshua Wylie Russon, PA-C 10/14/15 [id number]  Arby BarretteMarcy Pfeiffer, MD 10/15/15 [phone]

## 2015-10-14 NOTE — Discharge Instructions (Signed)
Please read and follow all provided instructions.  Your diagnoses today include:  1. Urethritis     Tests performed today include:  Test for gonorrhea and chlamydia. You will be notified by telephone if you have a positive result.  Vital signs. See below for your results today.   Medications:  You were treated for chlamydia (1 gram azithromycin pills) and gonorrhea (250mg  rocephin shot).  Home care instructions:  Read educational materials contained in this packet and follow any instructions provided.   You should tell your partners about your infection and avoid having sex for one week to allow time for the medicine to work.  Follow-up instructions: You [facility] to be tested for HIV, syphilis, and hepatitis -- all of which can be transmitted by sexual contact. We do not routinely screen for these in the Emergency Department.  STD Testing:  [facility] [place] [place] [facility]  [address], [place], phone [phone] or [phone]    Monday - Friday, call for an appointment  [facility] [place] [place] [facility]  [address], [place], phone [phone] or [phone]   Monday - Friday, call for an appointment  Return instructions:   Please return to the Emergency Department if you experience worsening symptoms.   Please return if you have any other emergent concerns.  Additional Information:  Your vital signs today were: BP 134/69 mmHg   Pulse 50   Temp(Src) 97.8 F (36.6 C) (Oral)   Resp 18   Ht 5\' 9"  (1.753 m)   Wt 86.183 kg   BMI 28.05 kg/m2   SpO2 100% If your blood pressure (BP) was elevated above 135/85 this visit, please have this repeated by your doctor within one month. --------------

## 2015-10-14 NOTE — ED Notes (Signed)
Penile discharge x 2 days. Burning with urination

## 2015-10-15 LAB — RPR: RPR Ser Ql: NONREACTIVE

## 2015-10-15 LAB — HIV ANTIBODY (ROUTINE TESTING W REFLEX): HIV Screen 4th Generation wRfx: NONREACTIVE

## 2015-10-16 LAB — GC/CHLAMYDIA PROBE AMP (~~LOC~~) NOT AT ARMC
CHLAMYDIA, DNA PROBE: NEGATIVE
Neisseria Gonorrhea: NEGATIVE
